# Patient Record
Sex: Male | Born: 1995 | Race: White | Hispanic: No | Marital: Single | Smoking: Never smoker
Health system: Southern US, Community
[De-identification: ages and names within clinical notes are randomized; demographics above are authoritative.]

## PROBLEM LIST (undated history)

## (undated) DIAGNOSIS — K219 Gastro-esophageal reflux disease without esophagitis: Secondary | ICD-10-CM

## (undated) DIAGNOSIS — F909 Attention-deficit hyperactivity disorder, unspecified type: Secondary | ICD-10-CM

## (undated) HISTORY — PX: ADENOIDECTOMY: SUR15

## (undated) HISTORY — PX: MYRINGOTOMY: SUR874

## (undated) HISTORY — PX: EYE SURGERY: SHX253

---

## 2017-06-03 ENCOUNTER — Encounter (HOSPITAL_COMMUNITY): Payer: Self-pay

## 2017-06-03 ENCOUNTER — Emergency Department (HOSPITAL_COMMUNITY): Payer: PRIVATE HEALTH INSURANCE

## 2017-06-03 ENCOUNTER — Emergency Department (HOSPITAL_COMMUNITY)
Admission: EM | Admit: 2017-06-03 | Discharge: 2017-06-04 | Disposition: A | Discharge status: Home / Self Care | Payer: PRIVATE HEALTH INSURANCE | Attending: Emergency Medicine | Admitting: Emergency Medicine

## 2017-06-03 DIAGNOSIS — Y999 Unspecified external cause status: Secondary | ICD-10-CM | POA: Diagnosis not present

## 2017-06-03 DIAGNOSIS — Z79899 Other long term (current) drug therapy: Secondary | ICD-10-CM | POA: Diagnosis not present

## 2017-06-03 DIAGNOSIS — W25XXXA Contact with sharp glass, initial encounter: Secondary | ICD-10-CM | POA: Insufficient documentation

## 2017-06-03 DIAGNOSIS — Z23 Encounter for immunization: Secondary | ICD-10-CM | POA: Insufficient documentation

## 2017-06-03 DIAGNOSIS — Y929 Unspecified place or not applicable: Secondary | ICD-10-CM | POA: Insufficient documentation

## 2017-06-03 DIAGNOSIS — S6982XA Other specified injuries of left wrist, hand and finger(s), initial encounter: Secondary | ICD-10-CM | POA: Diagnosis present

## 2017-06-03 DIAGNOSIS — S61217A Laceration without foreign body of left little finger without damage to nail, initial encounter: Secondary | ICD-10-CM | POA: Diagnosis not present

## 2017-06-03 DIAGNOSIS — Y939 Activity, unspecified: Secondary | ICD-10-CM | POA: Diagnosis not present

## 2017-06-03 MED ORDER — TETANUS-DIPHTH-ACELL PERTUSSIS 5-2.5-18.5 LF-MCG/0.5 IM SUSP
0.5000 mL | Freq: Once | INTRAMUSCULAR | Status: AC
  Administered 2017-06-03: 0.5 mL via INTRAMUSCULAR
  Filled 2017-06-03: qty 0.5

## 2017-06-03 MED ORDER — CEPHALEXIN 500 MG PO CAPS: 500.0000 mg | ORAL_CAPSULE | Freq: Four times a day (QID) | ORAL | 0 refills | Status: AC

## 2017-06-03 MED ORDER — ACETAMINOPHEN 325 MG PO TABS: 1000.0000 mg | ORAL_TABLET | Freq: Three times a day (TID) | ORAL | 0 refills | Status: DC | PRN

## 2017-06-03 MED ORDER — SULFAMETHOXAZOLE-TRIMETHOPRIM 800-160 MG PO TABS: 1.0000 | ORAL_TABLET | Freq: Two times a day (BID) | ORAL | 0 refills | Status: AC

## 2017-06-03 MED ORDER — LIDOCAINE-EPINEPHRINE (PF) 2 %-1:200000 IJ SOLN
10.0000 mL | Freq: Once | INTRAMUSCULAR | Status: AC
  Administered 2017-06-03: 10 mL
  Filled 2017-06-03: qty 20

## 2017-06-03 NOTE — ED Provider Notes (Signed)
[facility]-EMERGENCY DEPT Provider Note   CSN: [id number] Arrival date & time: 06/03/17  2032     History   Chief Complaint Chief Complaint  Patient presents with  . Extremity Laceration    HPI James Combs is a 21 y.o. male presenting with laceration of the left pinky.  Patient states that he was using a knife to try to open a bottle of wine when the knife went through the cork and shattered the neck of the wine bottle. He believes he cut his finger on both the knife and glass. Bleeding was easily controlled at home. He denies any foreign bodies retained in his finger. He denies numbness or tingling of the pinky. He is not on blood thinners. He has no other medical problems and does not take any medications on a daily basis. His last tetanus shot was within the past 10 years, but he does not know exactly when. He requests another today. 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 He does not currently work. He is left-handed.  HPI  History reviewed. No pertinent past medical history.  There are no active problems to display for this patient.   History reviewed. No pertinent surgical history.     Home Medications    Prior to Admission medications   Medication Sig Start Date End Date Taking? Authorizing Provider  pantoprazole (PROTONIX) 40 MG tablet Take 40 mg by mouth daily. 03/31/17  Yes [provider]  acetaminophen (TYLENOL) 325 MG tablet Take 3 tablets (975 mg total) by mouth 3 (three) times daily with meals as needed. 06/03/17   Farhiya Rosten, PA-C  cephALEXin (KEFLEX) 500 MG capsule Take 1 capsule (500 mg total) by mouth 4 (four) times daily. 06/03/17 06/08/17  Maddax Palinkas, PA-C  sulfamethoxazole-trimethoprim (BACTRIM DS,SEPTRA DS) 800-160 MG tablet Take 1 tablet by mouth 2 (two) times daily. 06/03/17 06/08/17  Javona Bergevin, PA-C    Family History History reviewed. No pertinent family history.  Social History Social History  Substance Use  Topics  . Smoking status: Never Smoker  . Smokeless tobacco: Never Used  . Alcohol use Yes     Allergies   Patient has no known allergies.   Review of Systems Review of Systems  Musculoskeletal: Positive for arthralgias and joint swelling.  Skin: Positive for wound.  Neurological: Negative for numbness.  Hematological: Does not bruise/bleed easily.     Physical Exam Updated Vital Signs BP (!) 145/78 (BP Location: Right Arm)   Pulse 79   Temp 97.9 F (36.6 C) (Oral)   Resp 16   Ht 6' (1.829 m)   Wt 104.3 kg (230 lb)   SpO2 100%   BMI 31.19 kg/m   Physical Exam  Constitutional: He is oriented to person, place, and time. He appears well-developed and well-nourished. No distress.  HENT:  Head: Normocephalic and atraumatic.  Eyes: EOM are normal.  Neck: Normal range of motion.  Cardiovascular: Intact distal pulses.   Pulmonary/Chest: Effort normal.  Abdominal: He exhibits no distension.  Musculoskeletal: Normal range of motion.  1.5 cm longitudinal laceration on the ulnar aspect of the left proximal fifth phalanx. Additional 1 cm laceration at the distal fifth phalanx, not extending into the nail. Sensation intact. Patient unable to flex at the MCP, PIP, or DIP joints. Gross exam shows no obvious tendons. Good cap refill. Color and warmth equal to that of the other fingers. No injury noted elsewhere. Bleeding easily controlled.  Neurological: He is alert and oriented to  person, place, and time.  Skin: Skin is warm. No rash noted.  2 lacerations to left fifth phalanx  Psychiatric: He has a normal mood and affect.  Nursing note and vitals reviewed.    ED Treatments / Results  Labs (all labs ordered are listed, but only abnormal results are displayed) Labs Reviewed - No data to display  EKG  EKG Interpretation None       Radiology Dg Finger Little Left  Result Date: 06/03/2017 CLINICAL DATA:  Status post laceration to left fifth finger from paring knife.  Initial encounter. EXAM: LEFT LITTLE FINGER 2+V COMPARISON:  None. FINDINGS: There is no evidence of fracture or dislocation. No radiopaque foreign bodies are seen. Soft tissue disruption is noted at the distal aspect of the fifth finger. Visualized joint spaces are preserved. IMPRESSION: No evidence of fracture or dislocation. No radiopaque foreign bodies seen. Electronically Signed   By: Roanna Raider M.D.   On: 06/03/2017 23:42    Procedures .Marland KitchenLaceration Repair Date/Time: 06/03/2017 11:47 PM Performed by: Alveria Apley Authorized by: Alveria Apley   Consent:    Consent obtained:  Verbal   Consent given by:  Patient   Risks discussed:  Infection, pain and poor wound healing   Alternatives discussed:  No treatment Anesthesia (see MAR for exact dosages):    Anesthesia method:  Local infiltration   Local anesthetic:  Lidocaine 2% WITH epi Laceration details:    Location:  Finger   Finger location:  L small finger   Length (cm):  1.5 (additional lac at distal finger 1 cm)   Depth (mm):  3 (additional lac at distal finger 2 mm) Repair type:    Repair type:  Simple Pre-procedure details:    Preparation:  Patient was prepped and draped in usual sterile fashion and imaging obtained to evaluate for foreign bodies Exploration:    Wound exploration: entire depth of wound probed and visualized     Wound exploration comment:  Laceration viewed to its fullest extent in a nonbloody field   Wound extent: no foreign bodies/material noted     Wound extent comment:  No visible tendon damage   Contaminated: no   Treatment:    Area cleansed with:  Betadine   Amount of cleaning:  Standard   Irrigation solution:  Sterile saline   Irrigation volume:  250 Skin repair:    Repair method:  Sutures   Suture size:  5-0   Suture material:  Prolene   Suture technique:  Simple interrupted   Number of sutures:  3 (1 prox lac, 2 distal lac) Post-procedure details:    Dressing:  Sterile dressing  and splint for protection   Patient tolerance of procedure:  Tolerated well, no immediate complications    (including critical care time)  Medications Ordered in ED Medications  lidocaine-EPINEPHrine (XYLOCAINE W/EPI) 2 %-1:200000 (PF) injection 10 mL (10 mLs Infiltration Given by Other 06/03/17 2320)  Tdap (BOOSTRIX) injection 0.5 mL (0.5 mLs Intramuscular Given 06/03/17 2358)     Initial Impression / Assessment and Plan / ED Course  I have reviewed the triage vital signs and the nursing notes.  Pertinent labs & imaging results that were available during my care of the patient were reviewed by me and considered in my medical decision making (see chart for details).     Pt with 2 lac of L pinky. While no gross tendon damage seen, pt unable to flex pinky. No pain with passive flexion. Sensation intact and good cap refill.  Xray negative for foreign body or bony involvement. Discussed case with attending, and Dr. Adriana Simasook evaluated the pt. Consulted with Dr. Janee Mornhompson with hand surgery. Instructed to close with sutures, start abx, and pt to f/u with office. Pt requests splint to assist with protection and pain control. Will give tylenol for pain, and start abx. Aftercare instructions given. Return precautions given. Pt states he understands and agrees to plan.   Final Clinical Impressions(s) / ED Diagnoses   Final diagnoses:  Laceration of left little finger  Laceration of left little finger without foreign body without damage to nail, initial encounter    New Prescriptions Discharge Medication List as of 06/03/2017 11:53 PM    START taking these medications   Details  acetaminophen (TYLENOL) 325 MG tablet Take 3 tablets (975 mg total) by mouth 3 (three) times daily with meals as needed., Starting Sat 06/03/2017, Print    cephALEXin (KEFLEX) 500 MG capsule Take 1 capsule (500 mg total) by mouth 4 (four) times daily., Starting Sat 06/03/2017, Until Thu 06/08/2017, Print      sulfamethoxazole-trimethoprim (BACTRIM DS,SEPTRA DS) 800-160 MG tablet Take 1 tablet by mouth 2 (two) times daily., Starting Sat 06/03/2017, Until Thu 06/08/2017, Print         Gildfordaccavale, ConcordSophia, PA-C 06/04/17 [id number]    Donnetta Hutchingook, Brian, MD 06/04/17 1740

## 2017-06-03 NOTE — Discharge Instructions (Signed)
Use Tylenol as needed for pain control. You may take this up to 3 times a day. Take antibiotics as prescribed. Dr. Carollee Massedhompson's office will contact you on Monday morning to schedule an appointment.  Return to the emergency room if you develop fever, chills, pus draining from the site, or any new or worsening symptoms.   WOUND CARE  Keep area clean and dry for 24 hours. Do not remove bandage, if applied.  After 24 hours, remove bandage and wash wound gently with mild soap and warm water. Reapply a new bandage after cleaning wound, if directed.   Continue daily cleansing with soap and water until stitches are removed. Return if you experience any of the following signs of infection: Swelling, redness, pus drainage, streaking, fever >101.0 F  Return if you experience excessive bleeding that does not stop after 15-20 minutes of constant, firm pressure.

## 2017-06-03 NOTE — ED Triage Notes (Signed)
States cut left pink with glass from wine bottle and possible pairing knife bleeding controlled happened about 2000 pm tonight last tetanus unknown.

## 2017-06-05 ENCOUNTER — Other Ambulatory Visit: Payer: Self-pay | Admitting: Orthopedic Surgery

## 2017-06-05 NOTE — H&P (Signed)
James Combs is an 21 y.o. male.   CC / Reason for Visit: Left small finger problem HPI: This patient is a 21 year old, left-hand-dominant male who indicates that he was attempting to open a wine bottle when he cut himself with both the knife as well as the glass of the wine bottle.  The knife entered the volar aspect of his left hand at the fifth metacarpal crease.  There was also a laceration distally at the left small finger pulp.  The patient had several blue nylon sutures in both the distal laceration as well as the proximal laceration.  He was given an updated tetanus booster as well as 2 forms of antibiotics, Keflex and Bactrim which he continues to take.  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  He indicates that he is off on Wednesdays as well as Fridays.  He is here today for further evaluation and treatment.  No past medical history on file.  No past surgical history on file.  No family history on file. Social History:  reports that he has never smoked. He has never used smokeless tobacco. He reports that he drinks alcohol. He reports that he does not use drugs.  Allergies: No Known Allergies  No prescriptions prior to admission.    No results found for this or any previous visit (from the past 48 hour(s)). Dg Finger Little Left  Result Date: 06/03/2017 CLINICAL DATA:  Status post laceration to left fifth finger from paring knife. Initial encounter. EXAM: LEFT LITTLE FINGER 2+V COMPARISON:  None. FINDINGS: There is no evidence of fracture or dislocation. No radiopaque foreign bodies are seen. Soft tissue disruption is noted at the distal aspect of the fifth finger. Visualized joint spaces are preserved. IMPRESSION: No evidence of fracture or dislocation. No radiopaque foreign bodies seen. Electronically Signed   By: Roanna RaiderJeffery  Chang M.D.   On: 06/03/2017 23:42    Review of Systems  All other systems reviewed and are negative.   There were no vitals taken for this  visit. Physical Exam  Constitutional:  WD, WN, NAD HEENT:  NCAT, EOMI Neuro/Psych:  Alert & oriented to person, place, and time; appropriate mood & affect Lymphatic: No generalized UE edema or lymphadenopathy Extremities / MSK:  Both UE are normal with respect to appearance, ranges of motion, joint stability, muscle strength/tone, sensation, & perfusion except as otherwise noted:  The left small finger rests in a position of full extension.  The patient is unable to actively flex the digit at all at the MP, PIP or PIP joints.  The lacerations are well approximated with no signs of infection.  Light touch sensibility on both the radial and ulnar aspects of the digit are intact with no altered sensibility.  The other digits of the hand are capable of fully flexing and extending.  Labs / Xrays:  No radiographic studies obtained today.  Assessment: Left small finger flexor tendon laceration in zone 2 without nerve involvement of either radial or ulnar digital nerve.  Plan:  The findings are discussed at length with the patient.  The patient will be scheduled on Friday of this week for a left small finger zone 2 flexor tendon repair.  He will need therapy postoperatively and this is discussed as well.  He is also provided a note to gain assistance with note taking as needed.  The details of the operative procedure were discussed with the patient.  Questions were invited and answered.  In addition to the  goal of the procedure, the risks of the procedure to include but not limited to bleeding; infection; damage to the nerves or blood vessels that could result in bleeding, numbness, weakness, chronic pain, and the need for additional procedures; stiffness; the need for revision surgery; and anesthetic risks were reviewed.  No specific outcome was guaranteed or implied.  Informed consent was obtained.   Tangy Drozdowski A., MD 06/05/2017, 5:07 PM

## 2017-06-06 ENCOUNTER — Encounter (HOSPITAL_BASED_OUTPATIENT_CLINIC_OR_DEPARTMENT_OTHER): Payer: Self-pay | Admitting: *Deleted

## 2017-06-09 ENCOUNTER — Ambulatory Visit (HOSPITAL_BASED_OUTPATIENT_CLINIC_OR_DEPARTMENT_OTHER): Payer: PRIVATE HEALTH INSURANCE | Admitting: Anesthesiology

## 2017-06-09 ENCOUNTER — Ambulatory Visit (HOSPITAL_BASED_OUTPATIENT_CLINIC_OR_DEPARTMENT_OTHER)
Admission: RE | Admit: 2017-06-09 | Discharge: 2017-06-09 | Disposition: A | Discharge status: Home / Self Care | Payer: PRIVATE HEALTH INSURANCE | Source: Ambulatory Visit | Attending: Orthopedic Surgery | Admitting: Orthopedic Surgery

## 2017-06-09 ENCOUNTER — Encounter (HOSPITAL_BASED_OUTPATIENT_CLINIC_OR_DEPARTMENT_OTHER): Payer: Self-pay | Admitting: Anesthesiology

## 2017-06-09 ENCOUNTER — Encounter (HOSPITAL_BASED_OUTPATIENT_CLINIC_OR_DEPARTMENT_OTHER)
Admission: RE | Disposition: A | Discharge status: Home / Self Care | Payer: Self-pay | Source: Ambulatory Visit | Attending: Orthopedic Surgery

## 2017-06-09 DIAGNOSIS — S61217A Laceration without foreign body of left little finger without damage to nail, initial encounter: Secondary | ICD-10-CM | POA: Insufficient documentation

## 2017-06-09 DIAGNOSIS — W25XXXA Contact with sharp glass, initial encounter: Secondary | ICD-10-CM | POA: Insufficient documentation

## 2017-06-09 DIAGNOSIS — Z79899 Other long term (current) drug therapy: Secondary | ICD-10-CM | POA: Insufficient documentation

## 2017-06-09 DIAGNOSIS — K219 Gastro-esophageal reflux disease without esophagitis: Secondary | ICD-10-CM | POA: Insufficient documentation

## 2017-06-09 DIAGNOSIS — S66127A Laceration of flexor muscle, fascia and tendon of left little finger at wrist and hand level, initial encounter: Secondary | ICD-10-CM | POA: Insufficient documentation

## 2017-06-09 DIAGNOSIS — Y92099 Unspecified place in other non-institutional residence as the place of occurrence of the external cause: Secondary | ICD-10-CM | POA: Diagnosis not present

## 2017-06-09 HISTORY — PX: FLEXOR TENDON REPAIR: SHX6501

## 2017-06-09 HISTORY — DX: Gastro-esophageal reflux disease without esophagitis: K21.9

## 2017-06-09 HISTORY — DX: Attention-deficit hyperactivity disorder, unspecified type: F90.9

## 2017-06-09 SURGERY — REPAIR, TENDON, FLEXOR
Anesthesia: General | Site: Finger | Laterality: Left

## 2017-06-09 MED ORDER — FENTANYL CITRATE (PF) 100 MCG/2ML IJ SOLN
INTRAMUSCULAR | Status: AC
  Filled 2017-06-09: qty 2

## 2017-06-09 MED ORDER — LIDOCAINE HCL (CARDIAC) 20 MG/ML IV SOLN
INTRAVENOUS | Status: DC | PRN
  Administered 2017-06-09: 30 mg via INTRAVENOUS

## 2017-06-09 MED ORDER — 0.9 % SODIUM CHLORIDE (POUR BTL) OPTIME
TOPICAL | Status: DC | PRN
  Administered 2017-06-09: 500 mL

## 2017-06-09 MED ORDER — MIDAZOLAM HCL 5 MG/5ML IJ SOLN
INTRAMUSCULAR | Status: DC | PRN
  Administered 2017-06-09: 2 mg via INTRAVENOUS

## 2017-06-09 MED ORDER — PROMETHAZINE HCL 25 MG/ML IJ SOLN: 6.2500 mg | INTRAMUSCULAR | Status: DC | PRN

## 2017-06-09 MED ORDER — OXYCODONE HCL 5 MG/5ML PO SOLN: 5.0000 mg | Freq: Once | ORAL | Status: DC | PRN

## 2017-06-09 MED ORDER — BUPIVACAINE HCL (PF) 0.5 % IJ SOLN
INTRAMUSCULAR | Status: DC | PRN
  Administered 2017-06-09: 10 mL

## 2017-06-09 MED ORDER — IBUPROFEN 200 MG PO TABS: 600.0000 mg | ORAL_TABLET | Freq: Four times a day (QID) | ORAL | 0 refills | Status: AC | PRN

## 2017-06-09 MED ORDER — PROPOFOL 10 MG/ML IV BOLUS
INTRAVENOUS | Status: AC
  Filled 2017-06-09: qty 20

## 2017-06-09 MED ORDER — SCOPOLAMINE 1 MG/3DAYS TD PT72: 1.0000 | MEDICATED_PATCH | Freq: Once | TRANSDERMAL | Status: DC | PRN

## 2017-06-09 MED ORDER — LACTATED RINGERS IV SOLN
INTRAVENOUS | Status: DC
  Administered 2017-06-09 (×2): via INTRAVENOUS

## 2017-06-09 MED ORDER — FENTANYL CITRATE (PF) 100 MCG/2ML IJ SOLN: 50.0000 ug | INTRAMUSCULAR | Status: DC | PRN

## 2017-06-09 MED ORDER — PROPOFOL 10 MG/ML IV BOLUS
INTRAVENOUS | Status: DC | PRN
  Administered 2017-06-09: 200 mg via INTRAVENOUS

## 2017-06-09 MED ORDER — MIDAZOLAM HCL 2 MG/2ML IJ SOLN
INTRAMUSCULAR | Status: AC
  Filled 2017-06-09: qty 2

## 2017-06-09 MED ORDER — LIDOCAINE 2% (20 MG/ML) 5 ML SYRINGE
INTRAMUSCULAR | Status: AC
  Filled 2017-06-09: qty 5

## 2017-06-09 MED ORDER — CEFAZOLIN SODIUM-DEXTROSE 2-4 GM/100ML-% IV SOLN
INTRAVENOUS | Status: AC
  Filled 2017-06-09: qty 100

## 2017-06-09 MED ORDER — ONDANSETRON HCL 4 MG/2ML IJ SOLN
INTRAMUSCULAR | Status: AC
  Filled 2017-06-09: qty 2

## 2017-06-09 MED ORDER — FENTANYL CITRATE (PF) 100 MCG/2ML IJ SOLN
INTRAMUSCULAR | Status: DC | PRN
  Administered 2017-06-09 (×2): 25 ug via INTRAVENOUS
  Administered 2017-06-09: 100 ug via INTRAVENOUS

## 2017-06-09 MED ORDER — LIDOCAINE HCL 2 % IJ SOLN
INTRAMUSCULAR | Status: AC
  Filled 2017-06-09: qty 20

## 2017-06-09 MED ORDER — EPINEPHRINE 30 MG/30ML IJ SOLN
INTRAMUSCULAR | Status: AC
  Filled 2017-06-09: qty 1

## 2017-06-09 MED ORDER — ONDANSETRON HCL 4 MG/2ML IJ SOLN
INTRAMUSCULAR | Status: DC | PRN
  Administered 2017-06-09: 4 mg via INTRAVENOUS

## 2017-06-09 MED ORDER — LACTATED RINGERS IV SOLN
INTRAVENOUS | Status: DC
  Administered 2017-06-09: 10:00:00 via INTRAVENOUS

## 2017-06-09 MED ORDER — CEFAZOLIN SODIUM-DEXTROSE 2-4 GM/100ML-% IV SOLN
2.0000 g | INTRAVENOUS | Status: AC
  Administered 2017-06-09: 2 g via INTRAVENOUS

## 2017-06-09 MED ORDER — OXYCODONE HCL 5 MG PO TABS: 5.0000 mg | ORAL_TABLET | Freq: Once | ORAL | Status: DC | PRN

## 2017-06-09 MED ORDER — FENTANYL CITRATE (PF) 100 MCG/2ML IJ SOLN: 25.0000 ug | INTRAMUSCULAR | Status: DC | PRN

## 2017-06-09 MED ORDER — OXYCODONE HCL 5 MG PO TABS: 5.0000 mg | ORAL_TABLET | Freq: Four times a day (QID) | ORAL | 0 refills | Status: AC | PRN

## 2017-06-09 MED ORDER — BUPIVACAINE-EPINEPHRINE (PF) 0.5% -1:200000 IJ SOLN
INTRAMUSCULAR | Status: AC
  Filled 2017-06-09: qty 30

## 2017-06-09 MED ORDER — BUPIVACAINE HCL (PF) 0.5 % IJ SOLN
INTRAMUSCULAR | Status: AC
  Filled 2017-06-09: qty 60

## 2017-06-09 MED ORDER — DEXAMETHASONE SODIUM PHOSPHATE 10 MG/ML IJ SOLN
INTRAMUSCULAR | Status: DC | PRN
  Administered 2017-06-09: 10 mg via INTRAVENOUS

## 2017-06-09 MED ORDER — MEPERIDINE HCL 25 MG/ML IJ SOLN: 6.2500 mg | INTRAMUSCULAR | Status: DC | PRN

## 2017-06-09 MED ORDER — MIDAZOLAM HCL 2 MG/2ML IJ SOLN: 1.0000 mg | INTRAMUSCULAR | Status: DC | PRN

## 2017-06-09 MED ORDER — ACETAMINOPHEN 325 MG PO TABS: 650.0000 mg | ORAL_TABLET | Freq: Four times a day (QID) | ORAL | Status: AC | PRN

## 2017-06-09 MED ORDER — LIDOCAINE HCL (PF) 1 % IJ SOLN
INTRAMUSCULAR | Status: AC
  Filled 2017-06-09: qty 30

## 2017-06-09 MED ORDER — DEXAMETHASONE SODIUM PHOSPHATE 10 MG/ML IJ SOLN
INTRAMUSCULAR | Status: AC
  Filled 2017-06-09: qty 1

## 2017-06-09 MED ORDER — LACTATED RINGERS IV SOLN
INTRAVENOUS | Status: DC
Start: 2017-06-09 — End: 2017-06-09

## 2017-06-09 SURGICAL SUPPLY — 61 items
BLADE MINI RND TIP GREEN BEAV (BLADE) IMPLANT
BLADE SURG 15 STRL LF DISP TIS (BLADE) ×1 IMPLANT
BLADE SURG 15 STRL SS (BLADE) ×2
BNDG COHESIVE 4X5 TAN STRL (GAUZE/BANDAGES/DRESSINGS) ×3 IMPLANT
BNDG ESMARK 4X9 LF (GAUZE/BANDAGES/DRESSINGS) IMPLANT
BNDG GAUZE ELAST 4 BULKY (GAUZE/BANDAGES/DRESSINGS) ×3 IMPLANT
CHLORAPREP W/TINT 26ML (MISCELLANEOUS) ×3 IMPLANT
CORD BIPOLAR FORCEPS 12FT (ELECTRODE) ×3 IMPLANT
COVER BACK TABLE 60X90IN (DRAPES) ×3 IMPLANT
COVER MAYO STAND STRL (DRAPES) ×3 IMPLANT
CUFF TOURNIQUET SINGLE 18IN (TOURNIQUET CUFF) IMPLANT
DECANTER SPIKE VIAL GLASS SM (MISCELLANEOUS) IMPLANT
DRAIN PENROSE 1/4X12 LTX STRL (WOUND CARE) IMPLANT
DRAPE EXTREMITY T 121X128X90 (DRAPE) ×3 IMPLANT
DRAPE SURG 17X23 STRL (DRAPES) ×3 IMPLANT
DRSG EMULSION OIL 3X3 NADH (GAUZE/BANDAGES/DRESSINGS) ×3 IMPLANT
ELECT REM PT RETURN 9FT ADLT (ELECTROSURGICAL)
ELECTRODE REM PT RTRN 9FT ADLT (ELECTROSURGICAL) IMPLANT
GAUZE SPONGE 4X4 12PLY STRL (GAUZE/BANDAGES/DRESSINGS) ×3 IMPLANT
GAUZE SPONGE 4X4 12PLY STRL LF (GAUZE/BANDAGES/DRESSINGS) ×3 IMPLANT
GLOVE BIO SURGEON STRL SZ7.5 (GLOVE) ×3 IMPLANT
GLOVE BIOGEL PI IND STRL 7.0 (GLOVE) IMPLANT
GLOVE BIOGEL PI IND STRL 8 (GLOVE) ×1 IMPLANT
GLOVE BIOGEL PI INDICATOR 7.0 (GLOVE)
GLOVE BIOGEL PI INDICATOR 8 (GLOVE) ×2
GLOVE ECLIPSE 6.5 STRL STRAW (GLOVE) IMPLANT
GOWN STRL REUS W/ TWL LRG LVL3 (GOWN DISPOSABLE) ×2 IMPLANT
GOWN STRL REUS W/TWL LRG LVL3 (GOWN DISPOSABLE) ×4
GOWN STRL REUS W/TWL XL LVL3 (GOWN DISPOSABLE) ×3 IMPLANT
LOOP VESSEL MAXI BLUE (MISCELLANEOUS) IMPLANT
LOOP VESSEL MINI RED (MISCELLANEOUS) IMPLANT
NEEDLE HYPO 25X1 1.5 SAFETY (NEEDLE) IMPLANT
NS IRRIG 1000ML POUR BTL (IV SOLUTION) ×3 IMPLANT
PACK BASIN DAY SURGERY FS (CUSTOM PROCEDURE TRAY) ×3 IMPLANT
PADDING CAST ABS 4INX4YD NS (CAST SUPPLIES) ×2
PADDING CAST ABS COTTON 4X4 ST (CAST SUPPLIES) ×1 IMPLANT
PENCIL BUTTON HOLSTER BLD 10FT (ELECTRODE) IMPLANT
RUBBERBAND STERILE (MISCELLANEOUS) ×3 IMPLANT
SLEEVE SCD COMPRESS KNEE MED (MISCELLANEOUS) IMPLANT
SLING ARM FOAM STRAP LRG (SOFTGOODS) IMPLANT
SPLINT PLASTER CAST XFAST 3X15 (CAST SUPPLIES) ×7 IMPLANT
SPLINT PLASTER XTRA FASTSET 3X (CAST SUPPLIES) ×14
STOCKINETTE 6  STRL (DRAPES) ×2
STOCKINETTE 6 STRL (DRAPES) ×1 IMPLANT
SUT FIBERWIRE 2-0 18 17.9 3/8 (SUTURE)
SUT MERSILENE 4 0 P 3 (SUTURE) IMPLANT
SUT PROLENE 6 0 P 1 18 (SUTURE) ×3 IMPLANT
SUT SILK 4 0 PS 2 (SUTURE) IMPLANT
SUT STEEL 4 (SUTURE) IMPLANT
SUT SUPRAMID 3-0 (SUTURE) ×12 IMPLANT
SUT VICRYL 3-0 CR8 SH (SUTURE) ×3 IMPLANT
SUT VICRYL RAPIDE 4-0 (SUTURE) IMPLANT
SUT VICRYL RAPIDE 4/0 PS 2 (SUTURE) ×3 IMPLANT
SUTURE FIBERWR 2-0 18 17.9 3/8 (SUTURE) IMPLANT
SYR 10ML LL (SYRINGE) IMPLANT
SYR BULB 3OZ (MISCELLANEOUS) ×3 IMPLANT
TOWEL OR 17X24 6PK STRL BLUE (TOWEL DISPOSABLE) ×3 IMPLANT
TUBE CONNECTING 20'X1/4 (TUBING)
TUBE CONNECTING 20X1/4 (TUBING) IMPLANT
TUBE FEEDING 8FR 16IN STR KANG (MISCELLANEOUS) IMPLANT
UNDERPAD 30X30 (UNDERPADS AND DIAPERS) ×3 IMPLANT

## 2017-06-09 NOTE — Anesthesia Procedure Notes (Signed)
Procedure Name: LMA Insertion Date/Time: 06/09/2017 11:06 AM Performed by: Genevieve Norlander L Pre-anesthesia Checklist: Patient identified, Emergency Drugs available, Suction available, Patient being monitored and Timeout performed Patient Re-evaluated:Patient Re-evaluated prior to induction Oxygen Delivery Method: Circle system utilized Preoxygenation: Pre-oxygenation with 100% oxygen Induction Type: IV induction Ventilation: Mask ventilation without difficulty LMA: LMA inserted LMA Size: 5.0 Number of attempts: 1 Airway Equipment and Method: Bite block Placement Confirmation: positive ETCO2 Tube secured with: Tape Dental Injury: Teeth and Oropharynx as per pre-operative assessment

## 2017-06-09 NOTE — Interval H&P Note (Signed)
History and Physical Interval Note:  06/09/2017 10:59 AM  James Combs  has presented today for surgery, with the diagnosis of LEFT  SMALL FINGER FLEXOR TENDON LACERATION N39.767H  The various methods of treatment have been discussed with the patient and family. After consideration of risks, benefits and other options for treatment, the patient has consented to left small finger flexor tendon repair as a surgical intervention .  The patient's history has been reviewed, patient examined, no change in status, stable for surgery.  I have reviewed the patient's chart and labs.  Questions were answered to the patient's satisfaction.     [name] A.

## 2017-06-09 NOTE — Transfer of Care (Signed)
Immediate Anesthesia Transfer of Care Note  Patient: James Combs  Procedure(s) Performed: Procedure(s): LEFT SMALL FINGER FLEXOR TENDON REPAIR (Left)  Patient Location: PACU  Anesthesia Type:General  Level of Consciousness: sedated  Airway & Oxygen Therapy: Patient Spontanous Breathing and Patient connected to face mask oxygen  Post-op Assessment: Report given to RN and Post -op Vital signs reviewed and stable  Post vital signs: Reviewed and stable  Last Vitals:  Vitals:   06/09/17 0942  BP: 127/84  Pulse: 71  Resp: 17  Temp: 36.7 C  SpO2: 98%    Last Pain:  Vitals:   06/09/17 0942  TempSrc: Oral  PainSc:          Complications: No apparent anesthesia complications

## 2017-06-09 NOTE — Op Note (Signed)
06/09/2017  11:01 AM  PATIENT:  James Combs  21 y.o. male  PRE-OPERATIVE DIAGNOSIS:  Left small finger laceration with flexor tendon laceration  POST-OPERATIVE DIAGNOSIS:  Same  PROCEDURE:  Left small finger repair of FDP and FDS in zone 2, with distal portion of A-2 pulley repair, repair skin laceration 1.5 cm, removal of sutures  SURGEON: Cliffton Asters. Janee Morn, MD  PHYSICIAN ASSISTANT: Danielle Rankin, OPA-C  ANESTHESIA:  general  SPECIMENS:  None  DRAINS:   None  EBL:  less than 50 mL  PREOPERATIVE INDICATIONS:  James Combs is a  21 y.o. male with left small finger volar laceration, resulting in laceration of both FDP and FDS.  The risks benefits and alternatives were discussed with the patient preoperatively including but not limited to the risks of infection, bleeding, nerve injury, cardiopulmonary complications, the need for revision surgery, among others, and the patient verbalized understanding and consented to proceed.  OPERATIVE IMPLANTS: None  OPERATIVE PROCEDURE:  After receiving prophylactic antibiotics, the patient was escorted to the operative theatre and placed in a supine position.  General anesthesia was administered A surgical "time-out" was performed during which the planned procedure, proposed operative site, and the correct patient identity were compared to the operative consent and agreement confirmed by the circulating nurse according to current facility policy.  Following application of a tourniquet to the operative extremity, the exposed skin was prepped with Chloraprep and draped in the usual sterile fashion.  An ulnar nerve block at the wrist was performed by me with plain Marcaine.  The limb was exsanguinated with an Esmarch bandage and the tourniquet inflated to approximately higher than systolic BP.  The traumatic wound had  the sutures removed.  There were also removed from the tip of the digit.  The traumatic wound was minimally excisionally  debrided at the jagged skin edges.  It was extended in a combination of Bruner and mid axial extensions proximally and distally in the flaps were made full-thickness and reflected.  The digital neurovascular bundles were intact.  The distal portion of the A2 pulley was lacerated such that there was a proximally based flap, but it was not all of the A2 pulley.  The remainder of the window of the sheath between the A2 and A4 pulley was opened and reflected radially.  The tendons were delivered proximally and distally.  The radial slip of the FDS was excised in the ulnar slip was advanced in its native position, under the A2 pulley, and repaired with 3-0 Supramid core suture and 6-0 Prolene epitendinous suture for augmentation.  FDP was repaired with a 4 strand core repair of 3-0 Supramid, with the knots inside the repair, and a 6-0 Prolene epitendinous suture in a running and running locked fashion.  There was no gapping in either repair site with full passive extension.  Satisfied with those, the proximally based flap of A2 pulley distally was laid back in its native position and ulnarly it was secured with loose 6-0 Prolene figure-of-eight suture.  The wound was irrigated, the tourniquet released, and the skin reapproximated with 4-0 Vicryl Rapide interrupted sutures, to include both surgical incisions and the traumatic wound.  A bulky splint dressing was applied with a dorsal plaster component, placing the wrist in slight extension and the MP joints flexed.  He was taken to the recovery room in stable condition, breathing spontaneously  DISPOSITION: He'll be discharged home today with typical instructions, returning to hand therapy early next week to have his  postop splint dressing removed and begin appropriate flexor tendon rehabilitation.  He will follow-up with me in 10-15 days for wound check, splint check, review of his rehabilitation expectations, etc.  No work with the left hand in the  interim.          \

## 2017-06-09 NOTE — Anesthesia Postprocedure Evaluation (Signed)
Anesthesia Post Note  Patient: James Combs  Procedure(s) Performed: Procedure(s) (LRB): LEFT SMALL FINGER FLEXOR TENDON REPAIR (Left)     Patient location during evaluation: PACU Anesthesia Type: General Level of consciousness: awake and alert Pain management: pain level controlled Vital Signs Assessment: post-procedure vital signs reviewed and stable Respiratory status: spontaneous breathing, nonlabored ventilation, respiratory function stable and patient connected to nasal cannula oxygen Cardiovascular status: blood pressure returned to baseline and stable Postop Assessment: no signs of nausea or vomiting Anesthetic complications: no    Last Vitals:  Vitals:   06/09/17 1330 06/09/17 1345  BP: (!) 142/80 129/88  Pulse:  (!) 105  Resp: 19 16  Temp:  36.5 C  SpO2:  100%    Last Pain:  Vitals:   06/09/17 1345  TempSrc:   PainSc: 0-No pain                 Shelton Silvas

## 2017-06-09 NOTE — Discharge Instructions (Signed)
Discharge Instructions   You have a dressing with a plaster splint incorporated in it. Move your fingers as much as possible in the confines of the splint. Elevate your hand to reduce pain & swelling of the digits.  Ice over the operative site or in your armpit may be helpful to reduce pain & swelling.  DO NOT USE HEAT. Pain medicine has been prescribed for you.  Take tylenol 650 mg and Ibuprofen 600 mg over the counter together every 6 hours. The prescribed medicine is for severe pain as a rescue medicine. Leave the dressing in place until you return to our office.  You may shower, but keep the bandage clean & dry.  You may drive a car when you are off of prescription pain medications and can safely control your vehicle with both hands. Call our office to schedule a follow up for 10-15 days from the date of surgery. Therapy is scheduled at hand center on 06/14/2017 at 2:30pm.   Please call [phone] during normal business hours or [phone] after hours for any problems. Including the following:  - excessive redness of the incisions - drainage for more than 4 days - fever of more than 101.5 F  *Please note that pain medications will not be refilled after hours or on weekends.   Post Anesthesia Home Care Instructions  Activity: Get plenty of rest for the remainder of the day. A responsible individual must stay with you for 24 hours following the procedure.  For the next 24 hours, DO NOT: -Drive a car -Advertising copywriter -Drink alcoholic beverages -Take any medication unless instructed by your physician -Make any legal decisions or sign important papers.  Meals: Start with liquid foods such as gelatin or soup. Progress to regular foods as tolerated. Avoid greasy, spicy, heavy foods. If nausea and/or vomiting occur, drink only clear liquids until the nausea and/or vomiting subsides. Call your physician if vomiting continues.  Special Instructions/Symptoms: Your throat may feel  dry or sore from the anesthesia or the breathing tube placed in your throat during surgery. If this causes discomfort, gargle with warm salt water. The discomfort should disappear within 24 hours.  If you had a scopolamine patch placed behind your ear for the management of post- operative nausea and/or vomiting:  1. The medication in the patch is effective for 72 hours, after which it should be removed.  Wrap patch in a tissue and discard in the trash. Wash hands thoroughly with soap and water. 2. You may remove the patch earlier than 72 hours if you experience unpleasant side effects which may include dry mouth, dizziness or visual disturbances. 3. Avoid touching the patch. Wash your hands with soap and water after contact with the patch.  Regional Anesthesia Blocks  1. Numbness or the inability to move the "blocked" extremity may last from 3-48 hours after placement. The length of time depends on the medication injected and your individual response to the medication. If the numbness is not going away after 48 hours, call your surgeon.  2. The extremity that is blocked will need to be protected until the numbness is gone and the  Strength has returned. Because you cannot feel it, you will need to take extra care to avoid injury. Because it may be weak, you may have difficulty moving it or using it. You may not know what position it is in without looking at it while the block is in effect.  3. For blocks in the legs and feet, returning to  weight bearing and walking needs to be done carefully. You will need to wait until the numbness is entirely gone and the strength has returned. You should be able to move your leg and foot normally before you try and bear weight or walk. You will need someone to be with you when you first try to ensure you do not fall and possibly risk injury.  4. Bruising and tenderness at the needle site are common side effects and will resolve in a few days.  5. Persistent  numbness or new problems with movement should be communicated to the surgeon or the [facility] [phone] [facility] [phone]).

## 2017-06-09 NOTE — Anesthesia Preprocedure Evaluation (Addendum)
Anesthesia Evaluation  Patient identified by MRN, date of birth, ID band Patient awake    Reviewed: Allergy & Precautions, NPO status , Patient's Chart, lab work & pertinent test results  Airway Mallampati: II  TM Distance: >3 FB Neck ROM: Full    Dental  (+) Teeth Intact, Dental Advisory Given   Pulmonary neg pulmonary ROS,    breath sounds clear to auscultation       Cardiovascular negative cardio ROS   Rhythm:Regular Rate:Normal     Neuro/Psych PSYCHIATRIC DISORDERS negative neurological ROS     GI/Hepatic Neg liver ROS, GERD  Medicated,  Endo/Other  negative endocrine ROS  Renal/GU negative Renal ROS     Musculoskeletal negative musculoskeletal ROS (+)   Abdominal   Peds  Hematology negative hematology ROS (+)   Anesthesia Other Findings Day of surgery medications reviewed with the patient.  Reproductive/Obstetrics                            Anesthesia Physical Anesthesia Plan  ASA: II  Anesthesia Plan: General   Post-op Pain Management:    Induction: Intravenous  PONV Risk Score and Plan: 3 and Ondansetron, Dexamethasone, Midazolam and Propofol infusion  Airway Management Planned: LMA  Additional Equipment:   Intra-op Plan:   Post-operative Plan: Extubation in OR  Informed Consent: I have reviewed the patients History and Physical, chart, labs and discussed the procedure including the risks, benefits and alternatives for the proposed anesthesia with the patient or authorized representative who has indicated his/her understanding and acceptance.   Dental advisory given  Plan Discussed with: CRNA  Anesthesia Plan Comments:         Anesthesia Quick Evaluation

## 2017-06-12 ENCOUNTER — Encounter (HOSPITAL_BASED_OUTPATIENT_CLINIC_OR_DEPARTMENT_OTHER): Payer: Self-pay | Admitting: Orthopedic Surgery

## 2019-02-18 ENCOUNTER — Encounter (HOSPITAL_COMMUNITY): Payer: Self-pay | Admitting: Emergency Medicine

## 2019-02-18 ENCOUNTER — Emergency Department (HOSPITAL_COMMUNITY): Payer: 59

## 2019-02-18 ENCOUNTER — Other Ambulatory Visit: Payer: Self-pay

## 2019-02-18 ENCOUNTER — Emergency Department (HOSPITAL_COMMUNITY)
Admission: EM | Admit: 2019-02-18 | Discharge: 2019-02-18 | Disposition: A | Discharge status: Home / Self Care | Payer: 59 | Attending: Emergency Medicine | Admitting: Emergency Medicine

## 2019-02-18 DIAGNOSIS — R1031 Right lower quadrant pain: Secondary | ICD-10-CM | POA: Diagnosis present

## 2019-02-18 DIAGNOSIS — Z79899 Other long term (current) drug therapy: Secondary | ICD-10-CM | POA: Diagnosis not present

## 2019-02-18 LAB — URINALYSIS, ROUTINE W REFLEX MICROSCOPIC
Bilirubin Urine: NEGATIVE
Glucose, UA: NEGATIVE mg/dL
Hgb urine dipstick: NEGATIVE
Ketones, ur: 20 mg/dL — AB
Leukocytes,Ua: NEGATIVE
Nitrite: NEGATIVE
Protein, ur: NEGATIVE mg/dL
Specific Gravity, Urine: 1.014 (ref 1.005–1.030)
pH: 6 (ref 5.0–8.0)

## 2019-02-18 LAB — COMPREHENSIVE METABOLIC PANEL
ALT: 23 U/L (ref 0–44)
AST: 20 U/L (ref 15–41)
Albumin: 4.8 g/dL (ref 3.5–5.0)
Alkaline Phosphatase: 64 U/L (ref 38–126)
Anion gap: 16 — ABNORMAL HIGH (ref 5–15)
BUN: 8 mg/dL (ref 6–20)
CO2: 23 mmol/L (ref 22–32)
Calcium: 9.7 mg/dL (ref 8.9–10.3)
Chloride: 101 mmol/L (ref 98–111)
Creatinine, Ser: 0.91 mg/dL (ref 0.61–1.24)
GFR calc Af Amer: >60 mL/min (ref >60)
GFR calc non Af Amer: >60 mL/min (ref >60)
Glucose, Bld: 107 mg/dL — ABNORMAL HIGH (ref 70–99)
Potassium: 3.5 mmol/L (ref 3.5–5.1)
Sodium: 140 mmol/L (ref 135–145)
Total Bilirubin: 1 mg/dL (ref 0.3–1.2)
Total Protein: 8 g/dL (ref 6.5–8.1)

## 2019-02-18 LAB — CBC WITH DIFFERENTIAL/PLATELET
Abs Immature Granulocytes: 0.02 10*3/uL (ref 0.00–0.07)
Basophils Absolute: 0 10*3/uL (ref 0.0–0.1)
Basophils Relative: 0 %
Eosinophils Absolute: 0.1 10*3/uL (ref 0.0–0.5)
Eosinophils Relative: 1 %
HCT: 49.4 % (ref 39.0–52.0)
Hemoglobin: 16.9 g/dL (ref 13.0–17.0)
Immature Granulocytes: 0 %
Lymphocytes Relative: 43 %
Lymphs Abs: 3.6 10*3/uL (ref 0.7–4.0)
MCH: 30.1 pg (ref 26.0–34.0)
MCHC: 34.2 g/dL (ref 30.0–36.0)
MCV: 88.1 fL (ref 80.0–100.0)
Monocytes Absolute: 0.7 10*3/uL (ref 0.1–1.0)
Monocytes Relative: 8 %
Neutro Abs: 4 10*3/uL (ref 1.7–7.7)
Neutrophils Relative %: 48 %
Platelets: 260 10*3/uL (ref 150–400)
RBC: 5.61 MIL/uL (ref 4.22–5.81)
RDW: 12.7 % (ref 11.5–15.5)
WBC: 8.4 10*3/uL (ref 4.0–10.5)
nRBC: 0 % (ref 0.0–0.2)

## 2019-02-18 LAB — LACTIC ACID, PLASMA: Lactic Acid, Venous: 0.7 mmol/L (ref 0.5–1.9)

## 2019-02-18 MED ORDER — IOHEXOL 300 MG/ML  SOLN
100.0000 mL | Freq: Once | INTRAMUSCULAR | Status: AC | PRN
  Administered 2019-02-18: 05:00:00 100 mL via INTRAVENOUS

## 2019-02-18 MED ORDER — SODIUM CHLORIDE 0.9 % IV BOLUS
1000.0000 mL | Freq: Once | INTRAVENOUS | Status: AC
  Administered 2019-02-18: 05:00:00 1000 mL via INTRAVENOUS

## 2019-02-18 MED ORDER — DICYCLOMINE HCL 10 MG PO CAPS
20.0000 mg | ORAL_CAPSULE | Freq: Once | ORAL | Status: AC
  Administered 2019-02-18: 07:00:00 20 mg via ORAL
  Filled 2019-02-18: qty 2

## 2019-02-18 MED ORDER — DICYCLOMINE HCL 20 MG PO TABS: 20.0000 mg | ORAL_TABLET | Freq: Two times a day (BID) | ORAL | 0 refills | Status: AC

## 2019-02-18 NOTE — ED Provider Notes (Signed)
[facility] EMERGENCY DEPARTMENT Provider Note   CSN: [id number] Arrival date & time: 02/18/19  0331    History   Chief Complaint Chief Complaint  Patient presents with  . Abdominal Pain    HPI James Combs is a 23 y.o. male.     HPI  Patient is a 23-year male with past medical history of ADHD, GERD, presenting for right lower quadrant abdominal pain.  Patient reports his pain is been present for the past 2 to 3 days.  He states that it was dull and achy initially, however became more sharp this evening.  He describes pain in his lower back as well that is intermittent.  Patient reports decreased appetite but no nausea or vomiting.  He reports that he has had normal bowel movements however they have been more "greasy".  Denies melena or hematochezia.  He does report he is passing gas.  No fever or chills.  He denies testicular pain or swelling.  He does report some urinary discomfort but no hematuria.  No abdominal surgical history.  Past Medical History:  Diagnosis Date  . ADHD (attention deficit hyperactivity disorder)   . GERD (gastroesophageal reflux disease)     There are no active problems to display for this patient.   Past Surgical History:  Procedure Laterality Date  . ADENOIDECTOMY    . EYE SURGERY     Both eyes - 2014 in [place], [place]  . FLEXOR TENDON REPAIR Left 06/09/2017   Procedure: LEFT SMALL FINGER FLEXOR TENDON REPAIR;  Surgeon: Mack Hook, MD;  Location: [facility];  Service: Orthopedics;  Laterality: Left;  . MYRINGOTOMY     Bilateral        Home Medications    Prior to Admission medications   Medication Sig Start Date End Date Taking? Authorizing Provider  acetaminophen (TYLENOL) 325 MG tablet Take 2 tablets (650 mg total) by mouth every 6 (six) hours as needed for mild pain or moderate pain. 06/09/17   Mack Hook, MD  cephALEXin (KEFLEX) 500 MG capsule Take 500 mg by mouth 4 (four) times daily.     [provider]  ibuprofen (ADVIL) 200 MG tablet Take 3 tablets (600 mg total) by mouth every 6 (six) hours as needed for moderate pain. 06/09/17   Mack Hook, MD  oxyCODONE (ROXICODONE) 5 MG immediate release tablet Take 1 tablet (5 mg total) by mouth every 6 (six) hours as needed for breakthrough pain. 06/09/17   Mack Hook, MD  pantoprazole (PROTONIX) 40 MG tablet Take 40 mg by mouth daily. 03/31/17   [provider]  sulfamethoxazole-trimethoprim (BACTRIM DS,SEPTRA DS) 800-160 MG tablet Take 1 tablet by mouth 2 (two) times daily.    [provider]    Family History No family history on file.  Social History Social History   Tobacco Use  . Smoking status: Never Smoker  . Smokeless tobacco: Never Used  Substance Use Topics  . Alcohol use: Yes    Comment: Drinks occassionally  . Drug use: Yes    Types: Marijuana    Comment: Last smoked Marijuana in May 2018     Allergies   Patient has no known allergies.   Review of Systems Review of Systems  Constitutional: Positive for appetite change. Negative for chills and fever.  HENT: Negative for congestion and sore throat.   Eyes: Negative for visual disturbance.  Respiratory: Negative for cough, chest tightness and shortness of breath.   Cardiovascular: Negative for chest pain.  Gastrointestinal: Positive for abdominal pain. Negative for nausea and vomiting.  Genitourinary: Negative for dysuria and flank pain.  Musculoskeletal: Negative for back pain and myalgias.  Skin: Negative for rash.  Neurological: Negative for dizziness, syncope and headaches.     Physical Exam Updated Vital Signs BP 119/81   Pulse 74   Temp 97.6 F (36.4 C) (Oral)   Resp 16   Ht 5\' 11"  (1.803 m)   Wt 81.6 kg   SpO2 99%   BMI 25.10 kg/m   Physical Exam Vitals signs and nursing note reviewed.  Constitutional:      General: He is not in acute distress.    Appearance: He is well-developed.  HENT:     Head:  Normocephalic and atraumatic.  Eyes:     Conjunctiva/sclera: Conjunctivae normal.     Pupils: Pupils are equal, round, and reactive to light.  Neck:     Musculoskeletal: Normal range of motion and neck supple.  Cardiovascular:     Rate and Rhythm: Normal rate and regular rhythm.     Heart sounds: S1 normal and S2 normal. No murmur.  Pulmonary:     Effort: Pulmonary effort is normal.     Breath sounds: Normal breath sounds. No wheezing or rales.  Abdominal:     General: There is no distension.     Palpations: Abdomen is soft.     Tenderness: There is abdominal tenderness in the right lower quadrant. There is no guarding or rebound.  Musculoskeletal: Normal range of motion.        General: No deformity.  Lymphadenopathy:     Cervical: No cervical adenopathy.  Skin:    General: Skin is warm and dry.     Findings: No erythema or rash.  Neurological:     Mental Status: He is alert.     Comments: Cranial nerves grossly intact. Patient moves extremities symmetrically and with good coordination.  Psychiatric:        Behavior: Behavior normal.        Thought Content: Thought content normal.        Judgment: Judgment normal.      ED Treatments / Results  Labs (all labs ordered are listed, but only abnormal results are displayed) Labs Reviewed  COMPREHENSIVE METABOLIC PANEL - Abnormal; Notable for the following components:      Result Value   Glucose, Bld 107 (*)    Anion gap 16 (*)    All other components within normal limits  URINALYSIS, ROUTINE W REFLEX MICROSCOPIC - Abnormal; Notable for the following components:   Ketones, ur 20 (*)    All other components within normal limits  CBC WITH DIFFERENTIAL/PLATELET  LACTIC ACID, PLASMA    EKG None  Radiology Ct Abdomen Pelvis W Contrast  Result Date: 02/18/2019 CLINICAL DATA:  Right lower quadrant abdominal pain.  Diarrhea. EXAM: CT ABDOMEN AND PELVIS WITH CONTRAST TECHNIQUE: Multidetector CT imaging of the abdomen and  pelvis was performed using the standard protocol following bolus administration of intravenous contrast. CONTRAST:  OMNIPAQUE IOHEXOL 300 MG/ML  SOLN COMPARISON:  None. FINDINGS: Lower chest: The lung bases are clear without focal nodule, mass, or airspace disease. The heart size is normal. No significant pleural or pericardial effusion is present. Hepatobiliary: No focal liver abnormality is seen. No gallstones, gallbladder wall thickening, or biliary dilatation. Pancreas: Unremarkable. No pancreatic ductal dilatation or surrounding inflammatory changes. Spleen: Normal in size without focal abnormality. Adrenals/Urinary Tract: Adrenal glands are unremarkable. Kidneys are normal, without  renal calculi, focal lesion, or hydronephrosis. Bladder is unremarkable. Stomach/Bowel: The stomach and duodenum are within normal limits. The small bowel is within normal limits. Terminal ileum is normal. The appendix is visualized and normal. The ascending and transverse colon are within normal limits. The descending and sigmoid colon are normal. Vascular/Lymphatic: No significant vascular findings are present. No enlarged abdominal or pelvic lymph nodes. Reproductive: Prostate is unremarkable. Other: No abdominal wall hernia or abnormality. No abdominopelvic ascites. Musculoskeletal: Vertebral body heights and alignment are maintained. No focal lytic or blastic lesions are present. IMPRESSION: 1. Normal CT of the abdomen pelvis. No acute or focal lesion to explain right lower quadrant abdominal pain. 2. Normal CT appearance of the appendix. Electronically Signed   By: Marin Robertshristopher  Mattern M.D.   On: 02/18/2019 06:08    Procedures Procedures (including critical care time)  Medications Ordered in ED Medications  sodium chloride 0.9 % bolus 1,000 mL (0 mLs Intravenous Stopped 02/18/19 0617)  iohexol (OMNIPAQUE) 300 MG/ML solution 100 mL (100 mLs Intravenous Contrast Given 02/18/19 0521)     Initial Impression /  Assessment and Plan / ED Course  I have reviewed the triage vital signs and the nursing notes.  Pertinent labs & imaging results that were available during my care of the patient were reviewed by me and considered in my medical decision making (see chart for details).        Patient is nontoxic-appearing, afebrile, hemodynamically stable, and has a nonsurgical abdomen.  Differential diagnosis includes appendicitis, nephrolithiasis, diverticulitis, gastroenteritis, IBS, IBD.  Work-up significant for no leukocytosis.  Patient has no electrolyte derangements.  Anion gap is 16, however he does not have an acidosis.  He does not have any obvious unmeasured anions.  Feel that this is a normal variant in this situation.  Lactic acid is normal.  Urinalysis is clear without evidence of infection.  CT abdomen and pelvis obtained which shows no evidence of inflammatory changes around the appendix nor any other cause of patient's symptoms.  Will have patient initiate Bentyl, and follow-up with PCP and gastroenterology as needed.  Return precautions given for any worsening pain, tractable nausea or vomiting, or fevers with abdominal pain.  Patient is in understanding and agrees with plan of care.  Final Clinical Impressions(s) / ED Diagnoses   Final diagnoses:  RLQ abdominal pain    ED Discharge Orders         Ordered    dicyclomine (BENTYL) 20 MG tablet  2 times daily     02/18/19 0638           Elisha PonderMurray, Alyssa B, PA-C 02/18/19 [id number]    Marily MemosMesner, Jason, MD 02/18/19 [id number]

## 2019-02-18 NOTE — ED Notes (Signed)
Patient transported to CT 

## 2019-02-18 NOTE — ED Triage Notes (Signed)
Reports RLQ pain that he is unsure how long it has been going on.  Endorses vomiting 2 days ago after having an alcohol binge.  Also reports some "intestonal pain" and oily diarrhea since last night.

## 2019-02-18 NOTE — Discharge Instructions (Signed)
Please see the information and instructions below regarding your visit.  Your diagnoses today include:  1. RLQ abdominal pain     Your exam and testing today is reassuring that there is not a condition causing your abdominal pain that we immediately need to intervene on at this time.   Abdominal (belly) pain can be caused by many things. Your caregiver performed an examination and possibly ordered blood/urine tests and imaging (CT scan, x-rays, ultrasound). Many cases can be observed and treated at home after initial evaluation in the emergency department. Even though you are being discharged home, abdominal pain can be unpredictable. Therefore, you need a repeated exam if your pain does not resolve, returns, or worsens. Most patients with abdominal pain don't have to be admitted to the hospital or have surgery, but serious problems like appendicitis and gallbladder attacks can start out as nonspecific pain. Many abdominal conditions cannot be diagnosed in one visit, so follow-up evaluations are very important.  Tests performed today include: Blood counts and electrolytes Blood tests to check liver and kidney function Blood tests to check pancreas function Urine test to look for infection and pregnancy (in women) Vital signs. See below for your results today.   See side panel of your discharge paperwork for testing performed today. Vital signs are listed at the bottom of these instructions.   Medications prescribed:    Take any prescribed medications only as prescribed, and any over the counter medications only as directed on the packaging.  Home care instructions:  Tr y eating, but start with foods that have a lot of fluid in them. Good examples are soup, Jell-O, and popsicles. If you do OK with those foods, you can try soft, bland foods, such as plain yogurt. Foods that are high in carbohydrates ("carbs"), like bread or saltine crackers, can help settle your stomach. Some people also find  that ginger helps with nausea. You should avoid foods that have a lot of fat in them. They can make nausea worse. Call your doctor if your symptoms come back when you try to eat.  Please follow any educational materials contained in this packet.   Follow-up instructions: Please follow-up with your primary care provider in 2-3 days for further evaluation of your symptoms if they are not completely improved.   Please follow up with gastroenterology as needed for recurrent pain.   Return instructions:  Please return to the Emergency Department if you experience worsening symptoms.  SEEK IMMEDIATE MEDICAL ATTENTION IF: The pain does not go away or becomes severe  A temperature above 101F develops  Repeated vomiting occurs (multiple episodes)  The pain becomes localized to portions of the abdomen. The right side could possibly be appendicitis. In an adult, the left lower portion of the abdomen could be colitis or diverticulitis.  Blood is being passed in stools or vomit (bright red or black tarry stools)  You develop chest pain, difficulty breathing, dizziness or fainting, or become confused, poorly responsive, or inconsolable (young children) If you have any other emergent concerns regarding your health  Additional Information:   Your vital signs today were: BP 119/81    Pulse 74    Temp 97.6 F (36.4 C) (Oral)    Resp 16    Ht 5\' 11"  (1.803 m)    Wt 81.6 kg    SpO2 99%    BMI 25.10 kg/m  If your blood pressure (BP) was elevated on multiple readings during this visit above 130 for the top number  or above 80 for the bottom number, please have this repeated by your primary care provider within one month. --------------  Thank you for allowing [place] to participate in your care today.

## 2020-01-31 IMAGING — CT CT ABDOMEN AND PELVIS WITH CONTRAST
2 of 4 series · 16 of 46 positions shown, 18 images · IV contrast (APPLIED)
Comparison: None.

CLINICAL DATA: Right lower quadrant abdominal pain.  Diarrhea.

EXAM:
CT ABDOMEN AND PELVIS WITH CONTRAST
TECHNIQUE: Multidetector CT imaging of the abdomen and pelvis was performed
using the standard protocol following bolus administration of
intravenous contrast.
CONTRAST:  100mL OMNIPAQUE IOHEXOL 300 MG/ML  SOLN

[Series 3: abd/pelvis 5.0 · axial · 0.85mm/px · z∈[+666,+1081]mm · 13 of 93 slices shown, 15 images]
[im 5/93  soft-tissue]
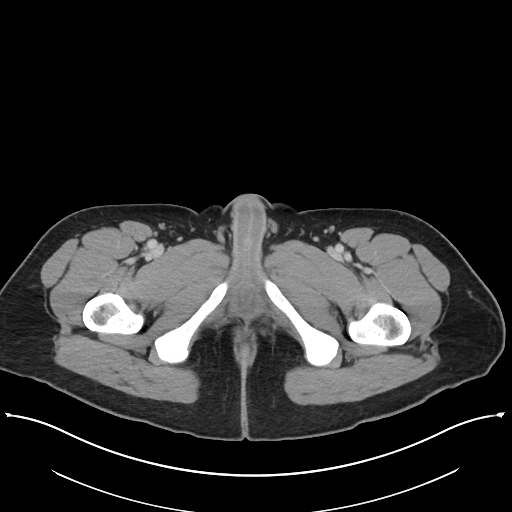
[im 5/93  bone]
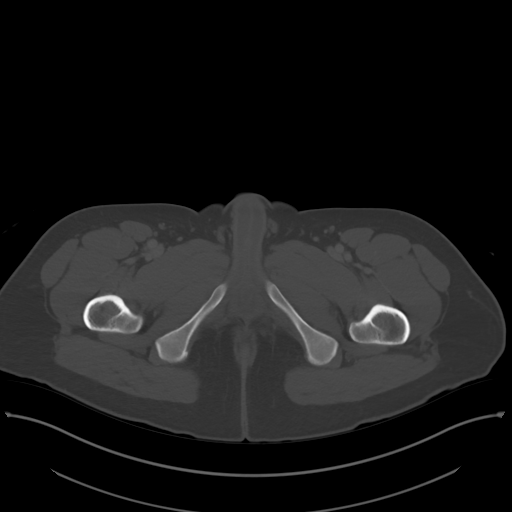
[im 15/93  soft-tissue]
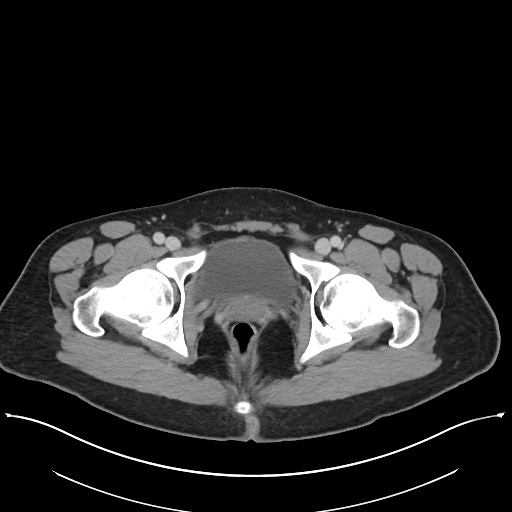
[im 20/93  soft-tissue]
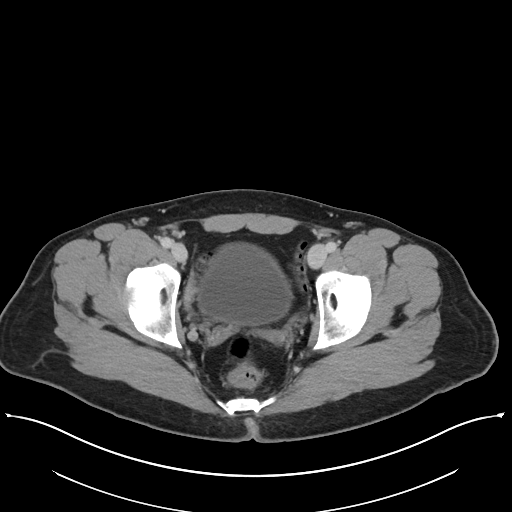
[im 25/93  soft-tissue]
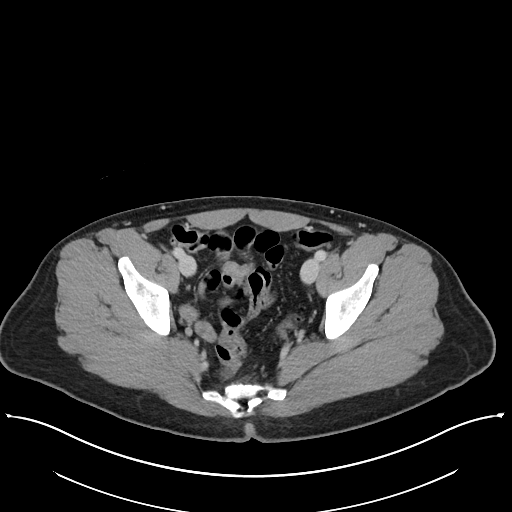
[im 34/93  soft-tissue]
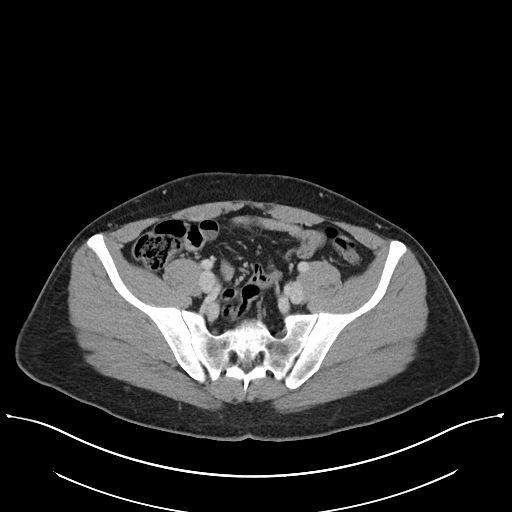
[im 39/93  soft-tissue]
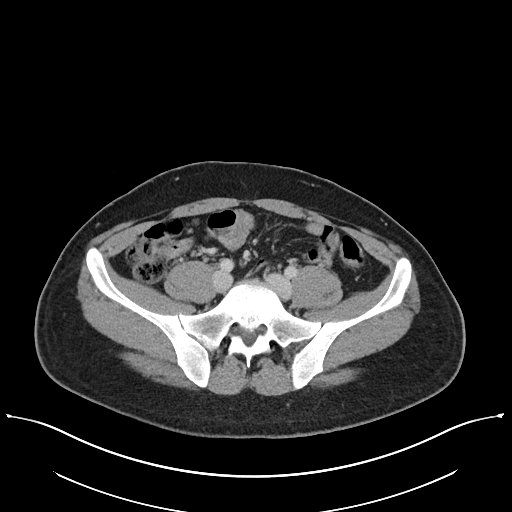
[im 49/93  soft-tissue]
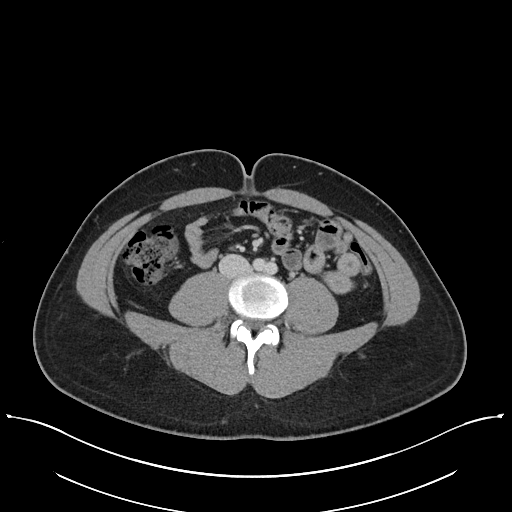
[im 54/93  soft-tissue]
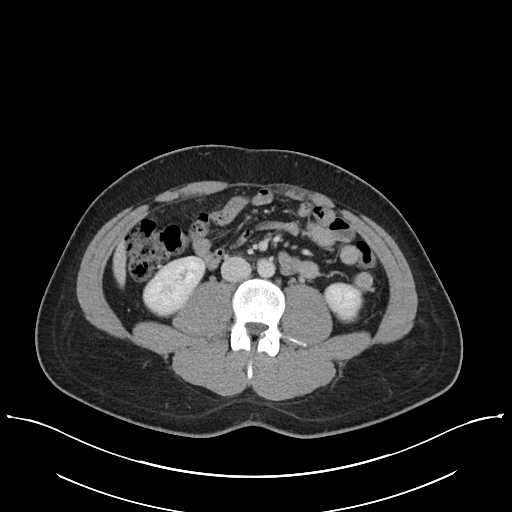
[im 59/93  soft-tissue]
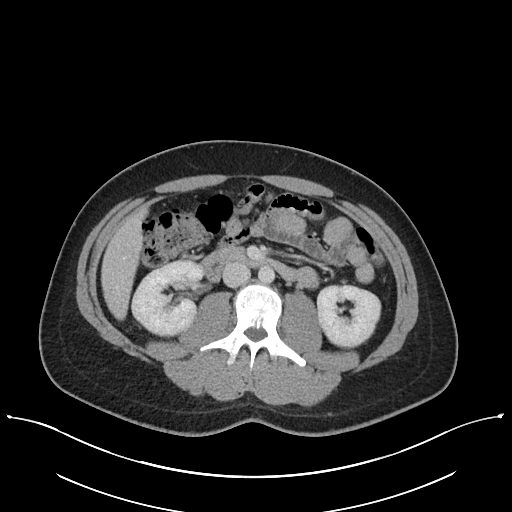
[im 59/93  bone]
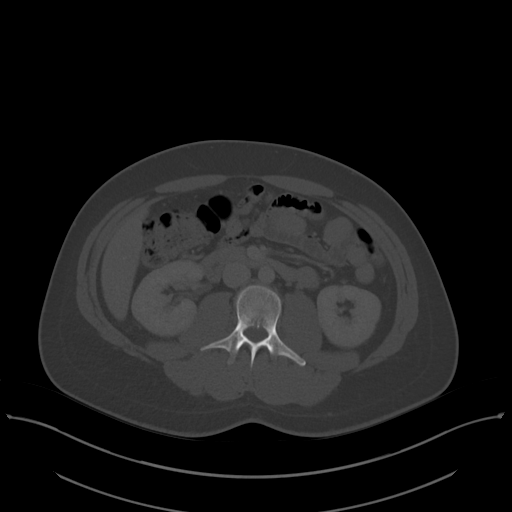
[im 68/93  soft-tissue]
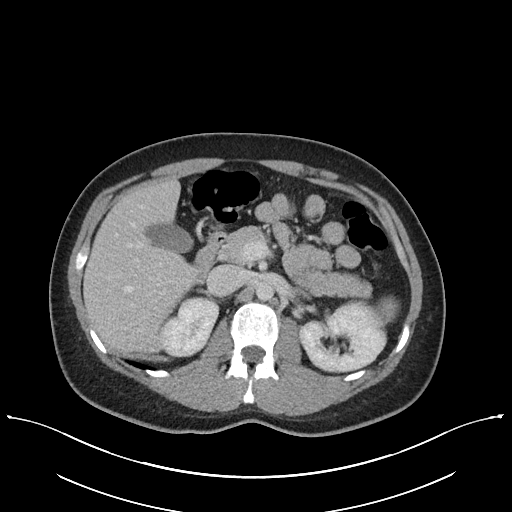
[im 73/93  soft-tissue]
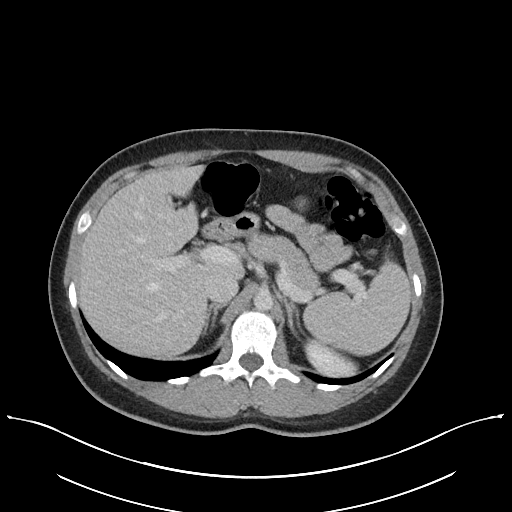
[im 78/93  soft-tissue]
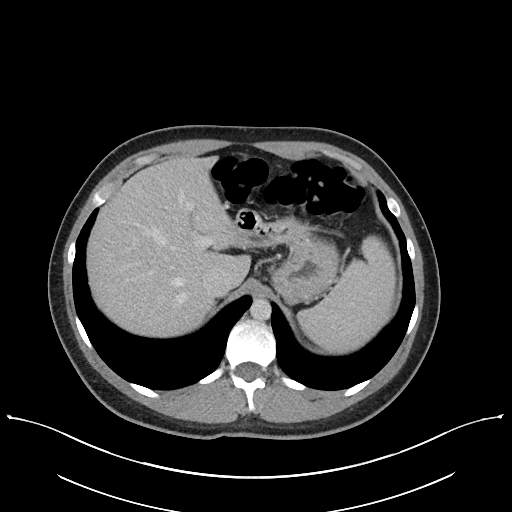
[im 88/93  soft-tissue]
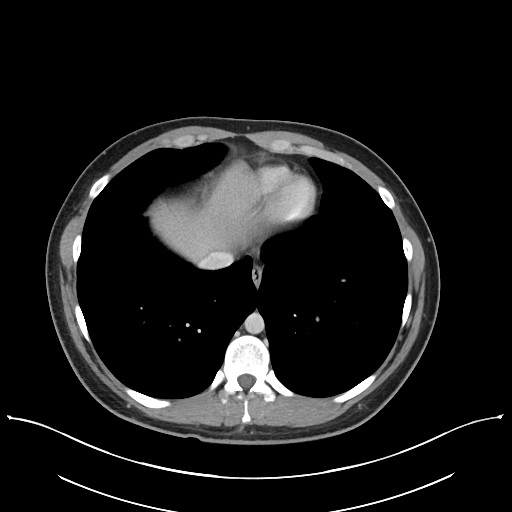

[Series 6: abd/pelvis 3.0 mpr cor · coronal · 0.76mm/px · 3 of 88 slices shown]
[im 30/88  soft-tissue]
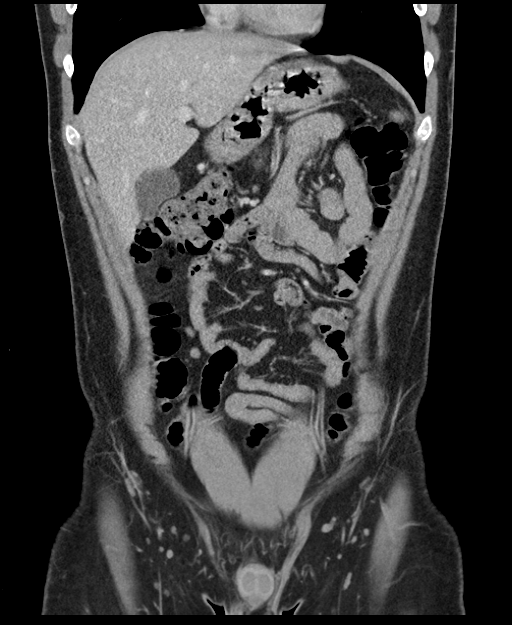
[im 39/88  soft-tissue]
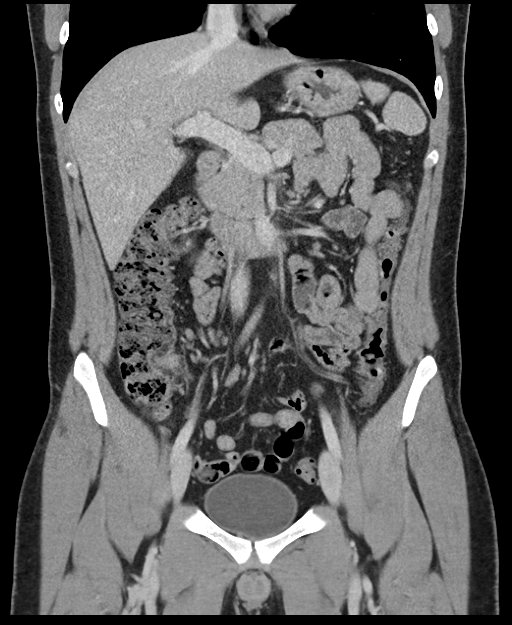
[im 49/88  soft-tissue]
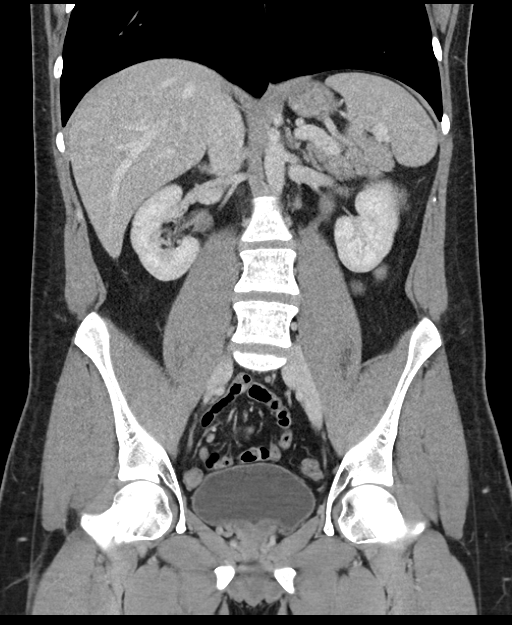

[16 of 46 positions shown; findings below may reference images not displayed]

FINDINGS: Lower chest: The lung bases are clear without focal nodule, mass, or
airspace disease. The heart size is normal. No significant pleural
or pericardial effusion is present.

Hepatobiliary: No focal liver abnormality is seen. No gallstones,
gallbladder wall thickening, or biliary dilatation.

Pancreas: Unremarkable. No pancreatic ductal dilatation or
surrounding inflammatory changes.

Spleen: Normal in size without focal abnormality.

Adrenals/Urinary Tract: Adrenal glands are unremarkable. Kidneys are
normal, without renal calculi, focal lesion, or hydronephrosis.
Bladder is unremarkable.

Stomach/Bowel: The stomach and duodenum are within normal limits.
The small bowel is within normal limits. Terminal ileum is normal.
The appendix is visualized and normal. The ascending and transverse
colon are within normal limits. The descending and sigmoid colon are
normal.

Vascular/Lymphatic: No significant vascular findings are present. No
enlarged abdominal or pelvic lymph nodes.

Reproductive: Prostate is unremarkable.

Other: No abdominal wall hernia or abnormality. No abdominopelvic
ascites.

Musculoskeletal: Vertebral body heights and alignment are
maintained. No focal lytic or blastic lesions are present.
IMPRESSION: 1. Normal CT of the abdomen pelvis. No acute or focal lesion to
explain right lower quadrant abdominal pain.
2. Normal CT appearance of the appendix.
